# Patient Record
Sex: Male | Born: 1985 | Race: Black or African American | Hispanic: No | Marital: Single | Smoking: Never smoker
Health system: Southern US, Community
[De-identification: ages and names within clinical notes are randomized; demographics above are authoritative.]

---

## 2004-06-01 ENCOUNTER — Emergency Department (HOSPITAL_COMMUNITY): Admission: EM | Admit: 2004-06-01 | Discharge: 2004-06-01 | Payer: Self-pay | Admitting: Emergency Medicine

## 2007-01-31 ENCOUNTER — Emergency Department: Payer: Self-pay | Admitting: Emergency Medicine

## 2009-01-04 ENCOUNTER — Emergency Department (HOSPITAL_COMMUNITY): Admission: EM | Admit: 2009-01-04 | Discharge: 2009-01-04 | Payer: Self-pay | Admitting: Emergency Medicine

## 2009-07-31 ENCOUNTER — Emergency Department: Payer: Self-pay | Admitting: Emergency Medicine

## 2011-08-31 ENCOUNTER — Emergency Department: Payer: Self-pay | Admitting: Emergency Medicine

## 2011-08-31 LAB — URINALYSIS, COMPLETE
Bacteria: NONE SEEN
Bilirubin,UR: NEGATIVE
Blood: NEGATIVE
Glucose,UR: NEGATIVE mg/dL (ref 0–75)
Ketone: NEGATIVE
Protein: NEGATIVE
RBC,UR: 1 /HPF (ref 0–5)
Squamous Epithelial: NONE SEEN
WBC UR: NONE SEEN /HPF (ref 0–5)

## 2011-08-31 LAB — BASIC METABOLIC PANEL
Anion Gap: 7 (ref 7–16)
BUN: 11 mg/dL (ref 7–18)
Co2: 29 mmol/L (ref 21–32)
Creatinine: 1.36 mg/dL — ABNORMAL HIGH (ref 0.60–1.30)
EGFR (African American): >60
Potassium: 4.3 mmol/L (ref 3.5–5.1)
Sodium: 141 mmol/L (ref 136–145)

## 2012-10-11 ENCOUNTER — Emergency Department: Payer: Self-pay | Admitting: Emergency Medicine

## 2013-08-20 ENCOUNTER — Encounter (HOSPITAL_COMMUNITY): Payer: Self-pay | Admitting: Emergency Medicine

## 2013-08-20 ENCOUNTER — Emergency Department (HOSPITAL_COMMUNITY)
Admission: EM | Admit: 2013-08-20 | Discharge: 2013-08-20 | Disposition: A | Discharge status: Home / Self Care | Payer: Self-pay | Attending: Emergency Medicine | Admitting: Emergency Medicine

## 2013-08-20 DIAGNOSIS — H109 Unspecified conjunctivitis: Secondary | ICD-10-CM

## 2013-08-20 DIAGNOSIS — R51 Headache: Secondary | ICD-10-CM | POA: Insufficient documentation

## 2013-08-20 DIAGNOSIS — B309 Viral conjunctivitis, unspecified: Secondary | ICD-10-CM | POA: Insufficient documentation

## 2013-08-20 MED ORDER — TETRACAINE HCL 0.5 % OP SOLN
2.0000 [drp] | Freq: Once | OPHTHALMIC | Status: AC
Start: 2013-08-20 — End: 2013-08-20
  Administered 2013-08-20: 2 [drp] via OPHTHALMIC
  Filled 2013-08-20: qty 2

## 2013-08-20 MED ORDER — NEOMYCIN-POLYMYXIN-HC 3.5-10000-1 OP SUSP: 2.0000 [drp] | OPHTHALMIC | Status: AC

## 2013-08-20 MED ORDER — FLUORESCEIN SODIUM 1 MG OP STRP
1.0000 | ORAL_STRIP | Freq: Once | OPHTHALMIC | Status: AC
  Administered 2013-08-20: 1 via OPHTHALMIC
  Filled 2013-08-20: qty 1

## 2013-08-20 NOTE — ED Notes (Signed)
Pt put contact in saline in specimen cup, pt has this cup.

## 2013-08-20 NOTE — Discharge Instructions (Signed)
Bacterial Conjunctivitis  Bacterial conjunctivitis, commonly called pink eye, is an inflammation of the clear membrane that covers the white part of the eye (conjunctiva). The inflammation can also happen on the underside of the eyelids. The blood vessels in the conjunctiva become inflamed causing the eye to become red or pink. Bacterial conjunctivitis may spread easily from one eye to another and from person to person (contagious).   CAUSES   Bacterial conjunctivitis is caused by bacteria. The bacteria may come from your own skin, your upper respiratory tract, or from someone else with bacterial conjunctivitis.  SYMPTOMS   The normally white color of the eye or the underside of the eyelid is usually pink or red. The pink eye is usually associated with irritation, tearing, and some sensitivity to light. Bacterial conjunctivitis is often associated with a thick, yellowish discharge from the eye. The discharge may turn into a crust on the eyelids overnight, which causes your eyelids to stick together. If a discharge is present, there may also be some blurred vision in the affected eye.  DIAGNOSIS   Bacterial conjunctivitis is diagnosed by your caregiver through an eye exam and the symptoms that you report. Your caregiver looks for changes in the surface tissues of your eyes, which may point to the specific type of conjunctivitis. A sample of any discharge may be collected on a cotton-tip swab if you have a severe case of conjunctivitis, if your cornea is affected, or if you keep getting repeat infections that do not respond to treatment. The sample will be sent to a lab to see if the inflammation is caused by a bacterial infection and to see if the infection will respond to antibiotic medicines.  TREATMENT   · Bacterial conjunctivitis is treated with antibiotics. Antibiotic eyedrops are most often used. However, antibiotic ointments are also available. Antibiotics pills are sometimes used. Artificial tears or eye  washes may ease discomfort.  HOME CARE INSTRUCTIONS   · To ease discomfort, apply a cool, clean wash cloth to your eye for 10 20 minutes, 3 4 times a day.  · Gently wipe away any drainage from your eye with a warm, wet washcloth or a cotton ball.  · Wash your hands often with soap and water. Use paper towels to dry your hands.  · Do not share towels or wash cloths. This may spread the infection.  · Change or wash your pillow case every day.  · You should not use eye makeup until the infection is gone.  · Do not operate machinery or drive if your vision is blurred.  · Stop using contacts lenses. Ask your caregiver how to sterilize or replace your contacts before using them again. This depends on the type of contact lenses that you use.  · When applying medicine to the infected eye, do not touch the edge of your eyelid with the eyedrop bottle or ointment tube.  SEEK IMMEDIATE MEDICAL CARE IF:   · Your infection has not improved within 3 days after beginning treatment.  · You had yellow discharge from your eye and it returns.  · You have increased eye pain.  · Your eye redness is spreading.  · Your vision becomes blurred.  · You have a fever or persistent symptoms for more than 2 3 days.  · You have a fever and your symptoms suddenly get worse.  · You have facial pain, redness, or swelling.  MAKE SURE YOU:   · Understand these instructions.  · Will watch your   condition.  · Will get help right away if you are not doing well or get worse.  Document Released: 06/29/2005 Document Revised: 03/23/2012 Document Reviewed: 11/30/2011  [employer]® Patient Information ©2014 [employer].

## 2013-08-20 NOTE — ED Notes (Signed)
Pt presents to department for evaluation of L eye irritation. States L eye watering, itching and discomfort. Ongoing x1 week. Denies visual problems. Pt is alert and oriented x4. No signs of acute distress noted.

## 2013-08-20 NOTE — ED Provider Notes (Signed)
CSN: [id number]     Arrival date & time 08/20/13  1546 History  This chart was scribed for Arthor Captain, PA-C, working with Darlys Gales, MD by Blanchard Kelch, ED Scribe. This patient was seen in room TR04C/TR04C and the patient's care was started at 4:12 PM.    Chief Complaint  Patient presents with  . Eye Pain    Patient is a 28 y.o. male presenting with eye pain. The history is provided by the patient. No language interpreter was used.  Eye Pain Pertinent negatives include no shortness of breath.    HPI Comments: Luis Coffey is a 28 y.o. male who presents to the Emergency Department complaining of constant left eye irritation that began a week and a half ago. He states that he believed it was due to irritation from his contacts so he took it out, but the irritation continued, described as a foreign body sensation. He has been unable to use a contact in that eye since then. He has associated eye redness, matting in the morning, photophobia, as well as a headache and left sided "facial warmth" that began yesterday. He also reports above baseline blurriness since the irritation began.The pain is worsened by movement of the eye. He denies taking any medication for the eye problem. He denies any pertinent past medical history. He does not know his baseline eye acuity.    History reviewed. No pertinent past medical history. History reviewed. No pertinent past surgical history. No family history on file. History  Substance Use Topics  . Smoking status: Never Smoker   . Smokeless tobacco: Not on file  . Alcohol Use: No    Review of Systems  Constitutional: Negative for fever.  HENT: Negative for drooling.   Eyes: Positive for photophobia, pain, discharge and redness.       Positive for blurred vision  Respiratory: Negative for shortness of breath.   Skin: Negative for rash and wound.  Psychiatric/Behavioral: Negative for confusion.    Allergies  Review of patient's allergies  indicates no known allergies.  Home Medications  No current outpatient prescriptions on file. Triage Vitals: BP 133/78  Pulse 94  Temp(Src) 97.9 F (36.6 C)  Resp 18  Ht 5\' 10"  (1.778 m)  Wt 222 lb (100.699 kg)  BMI 31.85 kg/m2  SpO2 98%  Physical Exam  Nursing note and vitals reviewed. Constitutional: He is oriented to person, place, and time. He appears well-developed and well-nourished. No distress.  HENT:  Head: Normocephalic and atraumatic.  Eyes: EOM are normal. Pupils are equal, round, and reactive to light. Left conjunctiva is injected.  Slit lamp exam:      The left eye shows no fluorescein uptake.  Left eye very injected. No ecchymosis or discharge noted.  Pressure of right eye is 13. Pressure of left eye is 12.  Neck: Neck supple. No tracheal deviation present.  Cardiovascular: Normal rate.   Pulmonary/Chest: Effort normal. No respiratory distress.  Musculoskeletal: Normal range of motion.  Neurological: He is alert and oriented to person, place, and time.  Skin: Skin is warm and dry.  Psychiatric: He has a normal mood and affect. His behavior is normal.    ED Course  Procedures (including critical care time)  DIAGNOSTIC STUDIES: Oxygen Saturation is 98% on room air, normal by my interpretation.    COORDINATION OF CARE: 4:17 PM- Will order tetracaine 0.5% ophthalmic solution 2 drops and one fluorescein ophthalmic strip. Patient verbalizes understanding and agrees with treatment plan.  4:21 PM- Visual  Acuity performed - R Near: 20/25 ; L Near: 20/30    Labs Review Labs Reviewed - No data to display Imaging Review No results found.  EKG Interpretation   None       MDM   1. Conjunctivitis    Viral conjunctivitis  Patient presentation consistent withconjunctivitis.  No corneal abrasions, entrapment, consensual photophobia, or dendritic staining with fluorescein study.  Presentation non-concerning for iritis, bacterial conjunctivitis, corneal  abrasions, or HSV.  antibiotics are indicated and patient will be prescribed polysporin.  Personal hygiene and frequent handwashing discussed.  Patient advised to followup with ophthalmologist if symptoms persist or worsen in any way including vision change or purulent discharge.  Patient verbalizes understanding and is agreeable with discharge.   I personally performed the services described in this documentation, which was scribed in my presence. The recorded information has been reviewed and is accurate.      Arthor CaptainAbigail Lou[employer] Base, PA-C 08/20/13 [phone]

## 2013-08-20 NOTE — ED Notes (Signed)
PA at bedside.

## 2013-08-21 NOTE — ED Provider Notes (Signed)
Medical screening examination/treatment/procedure(s) were performed by non-physician practitioner and as supervising physician I was immediately available for consultation/collaboration.  EKG Interpretation   None          Vlasta Baskin, MD 08/21/13 0027 

## 2014-10-11 IMAGING — CR RIGHT ANKLE - COMPLETE 3+ VIEW
1 series · 5 of 5 positions shown · non-contrast
Comparison: none

REASON FOR EXAM: pain
COMMENTS:

PROCEDURE:     DXR - DXR ANKLE RIGHT COMPLETE  - October 11, 2012  [DATE]
RESULT:     Right ankle images demonstrate no definite fracture, dislocation
or radiopaque foreign body.

[Series 1: x ankle ap right · 0.14mm/px · 5 of 5 slices shown]
[im 1/5]
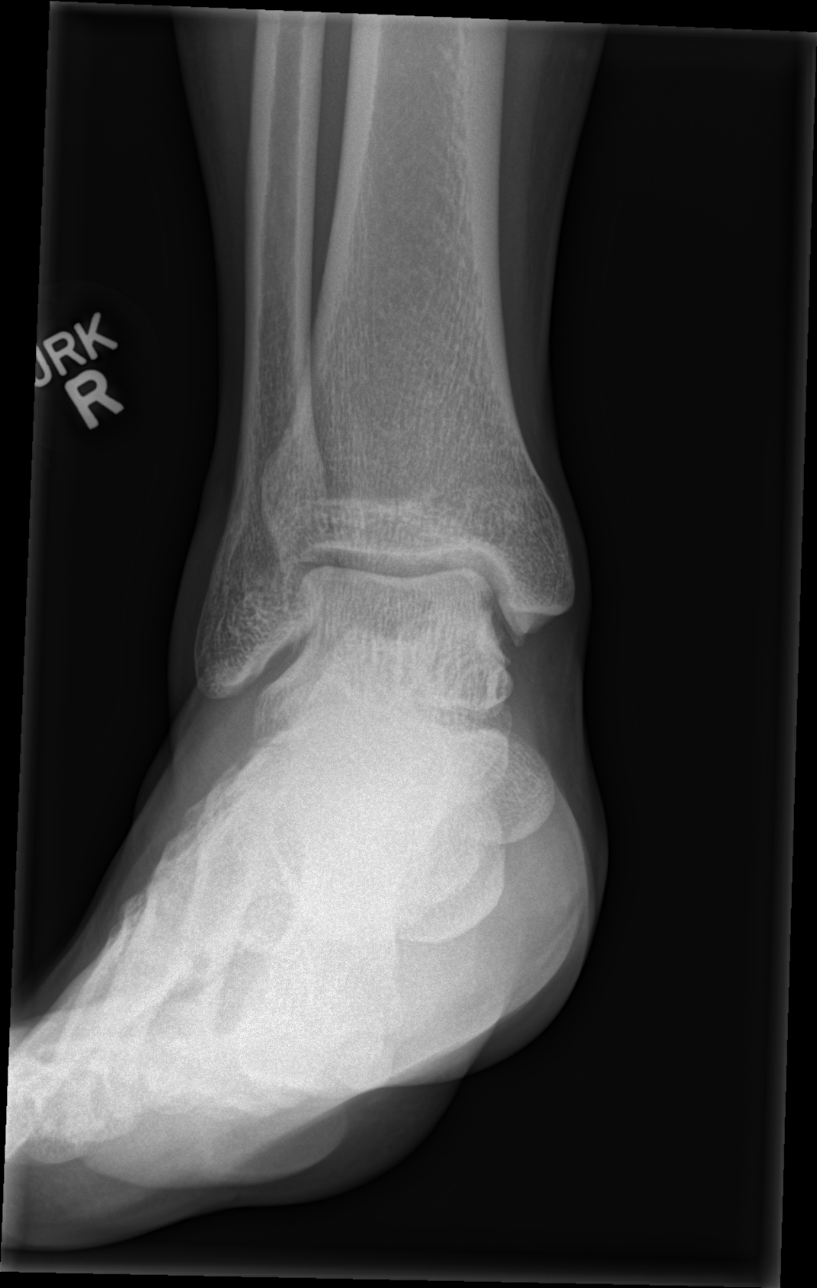
[im 2/5]
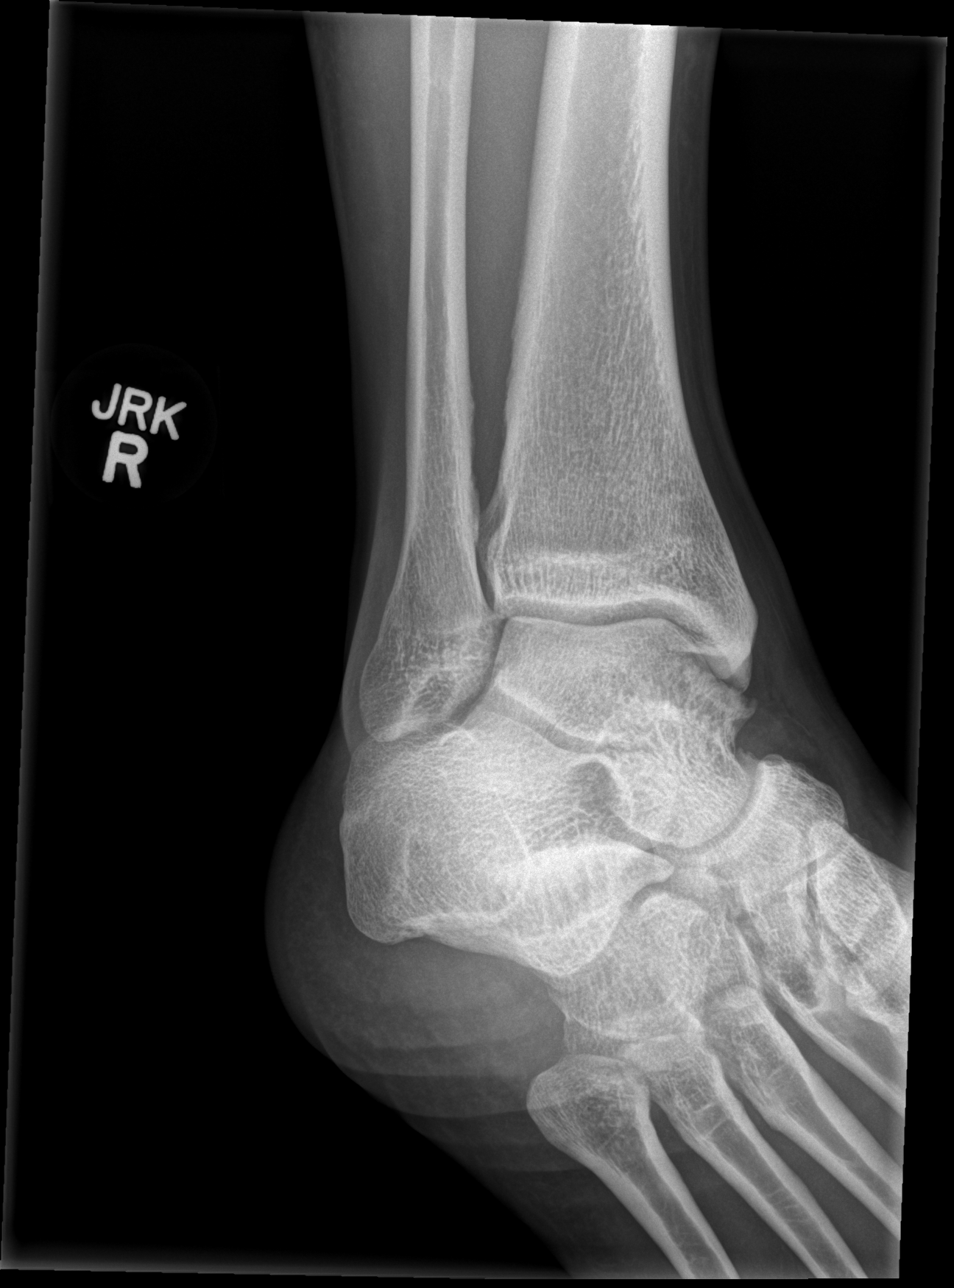
[im 3/5]
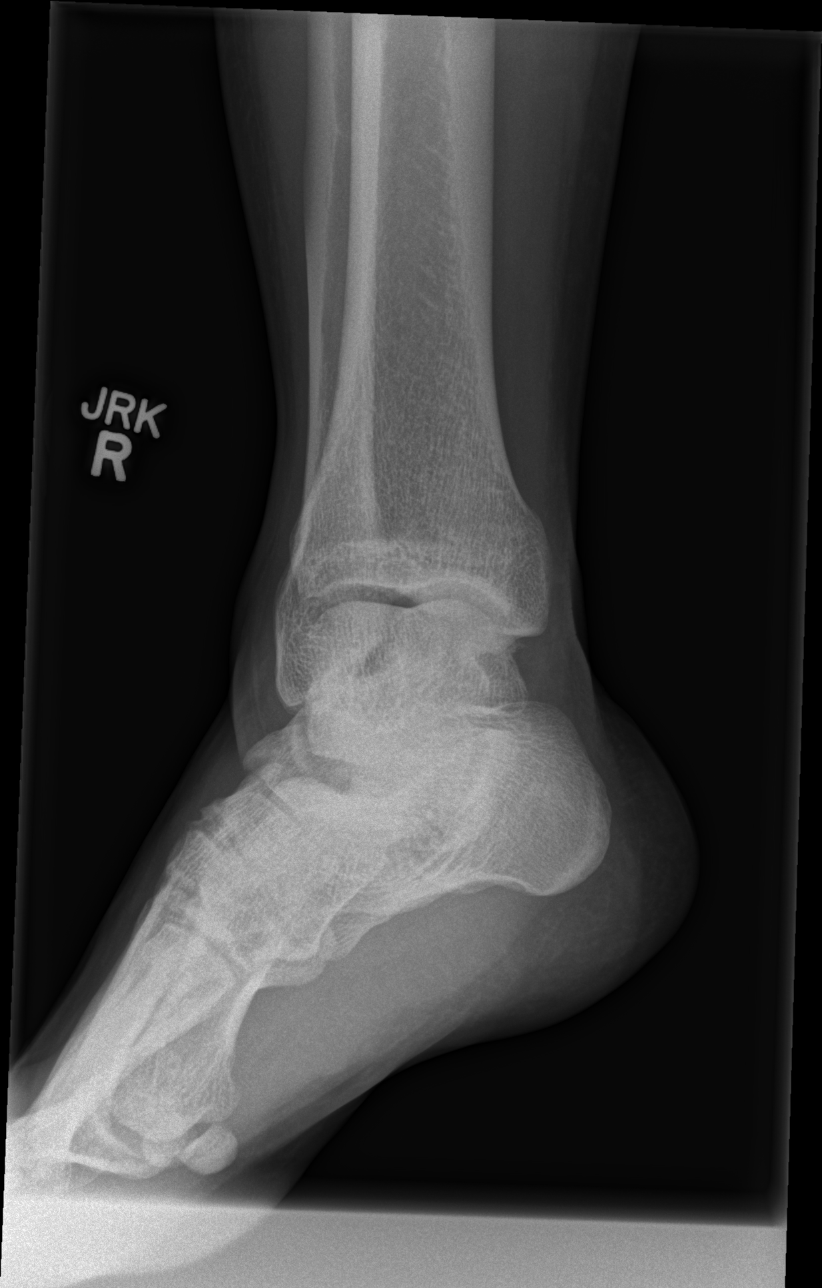
[im 4/5]
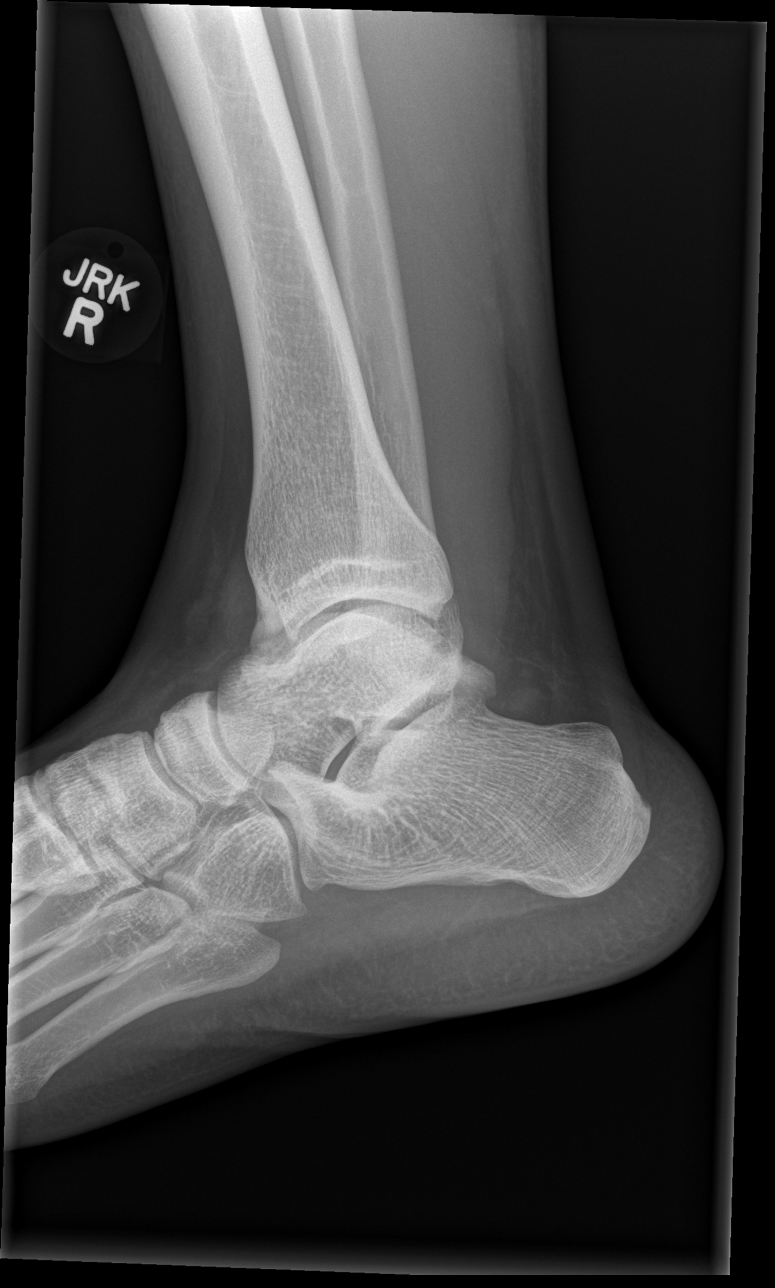
[im 5/5]
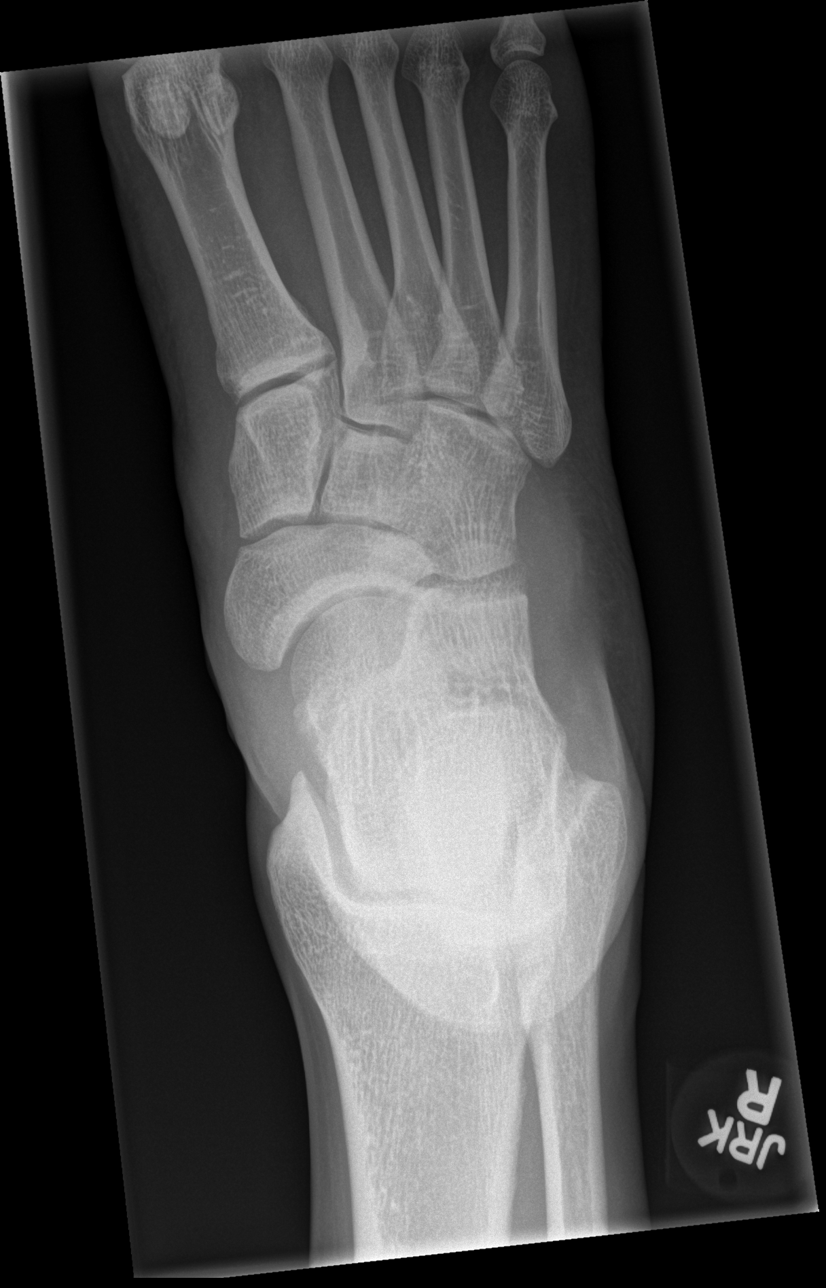

[5 of 5 positions shown; findings below may reference images not displayed]

IMPRESSION: No acute bony abnormality evident.

[REDACTED]

## 2022-11-10 ENCOUNTER — Ambulatory Visit
Admission: EM | Admit: 2022-11-10 | Discharge: 2022-11-10 | Disposition: A | Discharge status: Home / Self Care | Payer: Self-pay | Attending: Family Medicine | Admitting: Family Medicine

## 2022-11-10 DIAGNOSIS — Z113 Encounter for screening for infections with a predominantly sexual mode of transmission: Secondary | ICD-10-CM

## 2022-11-10 MED ORDER — CEFTRIAXONE SODIUM 500 MG IJ SOLR
500.0000 mg | INTRAMUSCULAR | Status: DC
  Administered 2022-11-10: 500 mg via INTRAMUSCULAR

## 2022-11-10 MED ORDER — DOXYCYCLINE HYCLATE 100 MG PO CAPS: 100.0000 mg | ORAL_CAPSULE | Freq: Two times a day (BID) | ORAL | 0 refills | Status: AC

## 2022-11-10 NOTE — ED Notes (Signed)
Pt refused cytology swab.

## 2022-11-10 NOTE — ED Triage Notes (Signed)
Pt presents with penile discharge X 3 days. 

## 2022-11-10 NOTE — ED Provider Notes (Signed)
  [facility]   [id number] 11/10/22 Arrival Time: 1717  ASSESSMENT & PLAN:  1. Screening for STDs (sexually transmitted diseases)       Discharge Instructions      You have been given the following today for treatment of suspected gonorrhea and/or chlamydia:  cefTRIAXone (ROCEPHIN) injection 500 mg  Please pick up your prescription for doxycycline 100 mg and begin taking twice daily for the next seven (7) days.  Even though we have treated you today, we have sent testing for sexually transmitted infections. We will notify you of any positive results once they are received. If required, we will prescribe any medications you might need.  Please refrain from all sexual activity for at least the next seven days.     Pending: Labs Reviewed  CYTOLOGY, (ORAL, ANAL, URETHRAL) ANCILLARY ONLY    Will notify of any positive results. Instructed to refrain from sexual activity for at least seven days.  Reviewed expectations re: course of current medical issues. Questions answered. Outlined signs and symptoms indicating need for more acute intervention. Patient verbalized understanding. After Visit Summary given.   SUBJECTIVE:  Luis Coffey is a 37 y.o. male who presents with complaint of penile discharge. First noticed  a couple of days ago . Describes discharge as thick and opaque and yellow. No specific aggravating or alleviating factors reported. Denies: urinary frequency, dysuria, and gross hematuria. Afebrile. No abdominal or pelvic pain. No n/v. No rashes or lesions. Reports that he is sexually active with multiple male partners.  OBJECTIVE:  Vitals:   11/10/22 1726  BP: (!) 141/78  Pulse: 64  Resp: 18  Temp: 98.2 F (36.8 C)  TempSrc: Oral  SpO2: 97%     General appearance: alert, cooperative, appears stated age and no distress GU: deferred Skin: warm and dry Psychological: alert and cooperative; normal mood and affect.  No results found for  this or any previous visit.  Labs Reviewed  CYTOLOGY, (ORAL, ANAL, URETHRAL) ANCILLARY ONLY    No Known Allergies  History reviewed. No pertinent past medical history. History reviewed. No pertinent family history. Social History   Socioeconomic History   Marital status: Single    Spouse name: Not on file   Number of children: Not on file   Years of education: Not on file   Highest education level: Not on file  Occupational History   Not on file  Tobacco Use   Smoking status: Never   Smokeless tobacco: Not on file  Substance and Sexual Activity   Alcohol use: No   Drug use: No   Sexual activity: Not on file  Other Topics Concern   Not on file  Social History Narrative   Not on file   Social Determinants of Health   Financial Resource Strain: Not on file  Food Insecurity: Not on file  Transportation Needs: Not on file  Physical Activity: Not on file  Stress: Not on file  Social Connections: Not on file  Intimate Partner Violence: Not on file           Mardella Layman, MD 11/10/22 1735

## 2022-11-10 NOTE — Discharge Instructions (Addendum)

## 2024-07-17 ENCOUNTER — Ambulatory Visit: Admitting: Family Medicine

## 2024-07-17 DIAGNOSIS — Z113 Encounter for screening for infections with a predominantly sexual mode of transmission: Secondary | ICD-10-CM

## 2024-07-17 LAB — HM HIV SCREENING LAB: HM HIV Screening: NEGATIVE

## 2024-07-17 LAB — HM HEPATITIS C SCREENING LAB: HM Hepatitis Screen: NEGATIVE

## 2024-07-17 NOTE — Progress Notes (Signed)
 Pt here for STI screening.  No in house labs performed today.  Condoms given.-Mattie Novosel, RN

## 2024-07-17 NOTE — Progress Notes (Signed)
 " [facility] Department STI clinic [address], [address] [place] [address] Main phone: [phone]  STI screening visit  Subjective:  Luis Coffey is a 39 y.o. male being seen today for an STI screening visit. The patient reports they do not have symptoms.    Patient has the following medical conditions:  There are no active problems to display for this patient.  Chief Complaint  Patient presents with   SEXUALLY TRANSMITTED DISEASE    HPI Patient reports to clinic for STI testing. Denies symptoms. Reports hx of chlamydia years ago:  Reproductive considerations: Does the patient or their partner desires a pregnancy in the next year? No  See flowsheet for further details and programmatic requirements  Hyperlink available at the top of the signed note in blue.  Flow sheet content below:  Pregnancy Intention Screening Does the patient want to become pregnant in the next year?: N/A Does the patient's partner want to become pregnant in the next year?: No Would the patient like to discuss contraceptive options today?: N/A All Patients Anyone smoke around pt and/or pt's children?: No Anyone smoke inside pt's house?: No Anyone smoke inside car?: No Anyone smoke inside the workplace?: No Reason For STD Screen STD Screening: Is asymptomatic Have you ever had an STD?: Yes History of Antibiotic use in the past 2 weeks?: No STD Symptoms Denies all: Yes Risk Factors for Hep B Household, sexual, or needle sharing contact of a person infected with Hep B: No Sexual contact with a person who uses drugs not as prescribed?: No Currently or Ever used drugs not as prescribed: No HIV Positive: No PRep Patient: No Men who have sex with men: N/A Have Hepatitis C: No History of Incarceration: No History of Homeslessness?: No Anal sex following anal drug use?: No Risk Factors for Hep C Currently using drugs not as prescribed: No Sexual partner(s)  currently using drugs as not prescribed: No History of drug use: No HIV Positive: No People with a history of incarceration: No People born between the years of 25 and 40: No Abuse History Has patient ever been abused physically?: No Has patient ever been abused sexually?: No Does patient feel they have a problem with Anxiety?: No Does patient feel they have a problem with Depression?: No Counseling Patient counseled to use condoms with all sex: Condoms given RTC in 2-3 weeks for test results: Yes Clinic will call if test results abnormal before test result appt.: Yes Test results given to patient Patient counseled to use condoms with all sex: Condoms given  Screening for MPX risk:  Unexplained rash?  No   MSM?  No   Multiple or anonymous sex partners?  No   Any close or sexual contact with a person  diagnosed with MPX?  No   Any outside the [place]  where MPX is endemic?  No   High clinical suspicion for MPX?    -Unlikely to be chickenpox    -Lymphadenopathy    -Rash that presents in same phase of       evolution on any given body part  No   Does this patient meet CDC recommendations for vaccination against MPOX? No  You already have or anticipate having the following risks:  Your sex partner has the following risks: You're traveling to a county with a clade I MPOX outbreak and anticipate these risks: Occupational exposure  You had known or suspected exposure to someone with monkeypox You had a sex partner in  the past 2 weeks who was diagnosed with monkeypox You are a gay, bisexual, or other man who has sex with men, or are transgender or nonbinary and in the past 6 months have had any of the following: - A new diagnosis of one or more sexually transmitted diseases (e.g., chlamydia, gonorrhea, or syphilis) - More than one sex partner You have had any of the following in the past 6 months: - Sex at a commercial sex venue (like a sex club or bathhouse) - Sex related to a large  commercial event   or in a geographic area (city or county for example) where mpox virus transmission is occurring Sex with a new partner Sex at a commercial sex venue (e.g., a sex club or bathhouse) Sex in it consultant for money, goods, drugs, or other trade Sex in association with a large public event (e.g., a rave, party, or festival) i.e. certain people who work in a laboratory or healthcare facility   Infectious disease screenings: Vaccinated against HPV? Unknown  HIV Ever had a positive? Unknown Last test: none Results in chart:  No results found for: HMHIVSCREEN No results found for: HIV   Hep B Hep B status: unknown or no prior testing Received HBV vaccination? Unknown Received HBV testing for immunity? Unknown Results in chart:  No components found for: HMHEPBSCREEN  Do they qualify for HBV screening today? Yes Criteria:  -Household, sexual or needle sharing contact with HBV -History of drug use or homelessness -HIV positive -Those with known Hep C  Hep C Hep C status: unknown or no prior testing Results in chart:  No results found for: HMHEPCSCREEN No components found for: HEPC  Do they qualify for HCV screening today? Yes Criteria - since the last HCV result, does the patient have any of the following? - Current drug use - Have a partner with drug use - Has been incarcerated  Immunization history:   There is no immunization history on file for this patient.  The following portions of the patient's history were reviewed and updated as appropriate: allergies, current medications, past medical history, past social history, past surgical history and problem list.  Substance use screenings:  Uses tobacco products? No Uses vapes? No Uses alcohol? Yes Uses non-injectable substances that alter your mental status? No Uses non-prescribed injectable substances? No   There is no immunization history on file for this patient.  The following portions of the  patient's history were reviewed and updated as appropriate: allergies, current medications, past medical history, past social history, past surgical history and problem list.  Objective:  There were no vitals filed for this visit.  Physical Exam Vitals and nursing note reviewed.  Constitutional:      Appearance: Normal appearance.  HENT:     Head: Normocephalic and atraumatic.     Comments: No nits or hair loss of scalp, brows, and lashes    Mouth/Throat:     Mouth: Mucous membranes are moist.     Pharynx: Oropharynx is clear. No oropharyngeal exudate or posterior oropharyngeal erythema.  Eyes:     General:        Right eye: No discharge.        Left eye: No discharge.     Conjunctiva/sclera: Conjunctivae normal.     Right eye: Right conjunctiva is not injected. No exudate.    Left eye: Left conjunctiva is not injected. No exudate. Pulmonary:     Effort: Pulmonary effort is normal.  Abdominal:     Palpations:  There is no hepatomegaly.     Tenderness: There is no abdominal tenderness. There is no rebound.  Genitourinary:    Comments: Politely declined genital exam. Lymphadenopathy:     Cervical: No cervical adenopathy.     Upper Body:     Right upper body: No supraclavicular, axillary or epitrochlear adenopathy.     Left upper body: No supraclavicular, axillary or epitrochlear adenopathy.     Comments: Patient declines genital exam. Inguinal lymph nodes not assessed.   Skin:    General: Skin is warm and dry.     Findings: No lesion or rash.  Neurological:     Mental Status: He is alert and oriented to person, place, and time.      Assessment and Plan:  Luis Coffey is a 39 y.o. male presenting to the [facility] Department for STI screening.  Patient accepted the following screenings: urine CT/GC, HIV, RPR, Hep B, and Hep C  1. Screening for venereal disease (Primary)  - HIV/HCV [facility] - HBV Antigen/Antibody [facility] - Syphilis Serology,  [facility] - Chlamydia/GC NAA, Confirmation   Counseling: Recommended condom use with all sex Discussed importance of condom use for STI prevention Discussed time line for [facility] results and that patient will be called with positive results and encouraged patient to call if they had not heard in 2 weeks Recommended repeat testing in 3 months with positive results. Recommended returning for continued or worsening symptoms.   Return if symptoms worsen or fail to improve, for STI screening.  No future appointments.  Verneta Bers, [place] "

## 2024-07-20 LAB — CHLAMYDIA/GC NAA, CONFIRMATION
Chlamydia trachomatis, NAA: NEGATIVE
Neisseria gonorrhoeae, NAA: NEGATIVE

## 2024-07-25 LAB — HBV ANTIGEN/ANTIBODY STATE LAB
Hep B Core Total Ab: NONREACTIVE
Hep B S Ab: REACTIVE
Hepatitis B Surface Ag: NONREACTIVE
